# Patient Record
Sex: Female | Born: 1970 | Race: Black or African American | Hispanic: No | Marital: Single | Smoking: Never smoker
Health system: Southern US, Community
[De-identification: ages and names within clinical notes are randomized; demographics above are authoritative.]

---

## 1998-11-15 ENCOUNTER — Inpatient Hospital Stay (HOSPITAL_COMMUNITY): Admission: AD | Admit: 1998-11-15 | Discharge: 1998-11-17 | Payer: Self-pay | Admitting: Pediatrics

## 1998-12-24 ENCOUNTER — Other Ambulatory Visit: Admission: RE | Admit: 1998-12-24 | Discharge: 1998-12-24 | Payer: Self-pay | Admitting: Obstetrics and Gynecology

## 1999-01-15 ENCOUNTER — Other Ambulatory Visit: Admission: RE | Admit: 1999-01-15 | Discharge: 1999-01-15 | Payer: Self-pay | Admitting: Obstetrics and Gynecology

## 1999-01-15 ENCOUNTER — Encounter (INDEPENDENT_AMBULATORY_CARE_PROVIDER_SITE_OTHER): Payer: Self-pay

## 2001-03-11 ENCOUNTER — Other Ambulatory Visit: Admission: RE | Admit: 2001-03-11 | Discharge: 2001-03-11 | Payer: Self-pay | Admitting: Obstetrics and Gynecology

## 2002-06-03 ENCOUNTER — Encounter: Payer: Self-pay | Admitting: General Surgery

## 2002-06-03 ENCOUNTER — Ambulatory Visit (HOSPITAL_COMMUNITY): Admission: RE | Admit: 2002-06-03 | Discharge: 2002-06-04 | Payer: Self-pay | Admitting: General Surgery

## 2002-06-03 ENCOUNTER — Encounter (INDEPENDENT_AMBULATORY_CARE_PROVIDER_SITE_OTHER): Payer: Self-pay | Admitting: Specialist

## 2002-10-17 ENCOUNTER — Other Ambulatory Visit: Admission: RE | Admit: 2002-10-17 | Discharge: 2002-10-17 | Payer: Self-pay | Admitting: Obstetrics and Gynecology

## 2003-11-08 ENCOUNTER — Other Ambulatory Visit: Admission: RE | Admit: 2003-11-08 | Discharge: 2003-11-08 | Payer: Self-pay | Admitting: Obstetrics and Gynecology

## 2004-09-11 ENCOUNTER — Encounter: Admission: RE | Admit: 2004-09-11 | Discharge: 2004-09-11 | Payer: Self-pay | Admitting: Internal Medicine

## 2005-03-04 ENCOUNTER — Other Ambulatory Visit: Admission: RE | Admit: 2005-03-04 | Discharge: 2005-03-04 | Payer: Self-pay | Admitting: Obstetrics and Gynecology

## 2007-03-17 ENCOUNTER — Encounter: Admission: RE | Admit: 2007-03-17 | Discharge: 2007-03-17 | Payer: Self-pay | Admitting: Internal Medicine

## 2010-06-14 NOTE — Op Note (Signed)
NAME:  Erika Moran, Erika Moran                       ACCOUNT NO.:  [id number]   MEDICAL RECORD NO.:  [id number]                   PATIENT TYPE:  OIB   LOCATION:  2550                                 FACILITY:  [facility]   PHYSICIAN:  Gabrielle Dare. Janee Morn, M.D.             DATE OF BIRTH:  01/12/1971   DATE OF PROCEDURE:  06/03/2002  DATE OF DISCHARGE:                                 OPERATIVE REPORT   PREOPERATIVE DIAGNOSIS:  Symptomatic cholelithiasis.   POSTOPERATIVE DIAGNOSIS:  Symptomatic cholelithiasis.   OPERATION PERFORMED:  Laparoscopic cholecystectomy with intraoperative  cholangiogram.   SURGEON:  Gabrielle Dare. Janee Morn, M.D.   ASSISTANT:  Donnie Coffin. Samuella Cota, M.D.   ANESTHESIA:  General.   INDICATIONS FOR PROCEDURE:  The patient is a 40 year old African-American  female who has had increasing frequency of right upper quadrant pain over  the past year and a half.  Several weeks ago it was exacerbated and she was  worked up with an abdominal ultrasound which demonstrated findings of  cholelithiasis and some gallbladder wall thickening.  She presents for an  elective laparoscopic cholecystectomy.   DESCRIPTION OF PROCEDURE:  Informed consent was obtained.  The patient  received intravenous antibiotics.  She was taken to the operating room.  General anesthesia was administered.  Her abdomen was prepped and draped in  sterile fashion.  A curvilinear infraumbilical incision was made.  Subcutaneous tissues were dissected down to the anterior fascia which was  divided.  Subsequently, the peritoneal cavity was entered under direct  vision without difficulty.  A 0 Vicryl pursestring suture was placed around  the fascial opening and the Hasson trocar was inserted into the abdomen.  The abdomen was insufflated with carbon dioxide in standard fashion and an  11 mm epigastric and two 5 mm lateral ports were placed under direct vision.  The dome of the gallbladder was then retracted superomedially.   Several  omental adhesions were taken down from the gallbladder using the Bovie  cautery and the gallbladder was long and narrow and appeared to taper down  only to about 1.1 or 1.5 cm in diameter.  Further dissection revealed a  midcommon bile duct which externally appeared somewhat dilated.  The cystic  duct and common bile duct junction was not initially clearly visible.  Subsequently the gallbladder was released medially and laterally under  direct vision in order to facilitate better dissection.  The cystic artery  was identified near Calot's triangle as the cystic duct came more into view.  This artery was in the way of dissection.  It was clipped once distally,  twice proximally and divided.  Further dissection facilitated creation of a  window around the infundibulum and cystic duct area well above the common  bile duct.  Once a nice window was made between this area and the liver, a  Reddick cholangiogram catheter was inserted high up on the infundibulum  area.  Initially only a  cholecystogram could be obtained.  Subsequently  further dissection facilitated placement of the cholangiogram catheter a  little bit down towards the cystic duct.  Again the Reddick cholangiogram  catheter was inserted there.  An intraoperative cholangiogram was shot.  This demonstrated a somewhat dilated common duct with some decent length of  cystic duct remaining and there was no filling defect in the common duct and  the contrast flowed freely down into the duodenum.  Subsequently the Reddick  cholangiogram catheter was withdrawn.  This infundibulocystic duct area was  divided as it was too large to be sealed with a clip.  After it was divided,  the cystic duct was closed with a 0 PDS Endoloop without difficulty.  There  was good integrity of the closure on inspection.  Subsequently, the  gallbladder was taken off of the liver bed with Bovie cautery.  The cautery  was used to get good hemostasis on the  liver bed.  Once the gallbladder was  taken off of the liver bed, it was placed in an EndoCatch bag and removed  via the umbilical port site.  At this time the abdomen was copiously  irrigated.  The liver bed was reinspected and several areas were cauterized  for excellent hemostasis.  A total of 3L of irrigation was used as there was  some bile spillage due to the fact that we had to divide the infundibular  area.  The abdomen was copiously irrigated.  Meticulous hemostasis of the  liver bed was ensured and the irrigation was suctioned out and it was clear.  The port sites were removed under direct vision and pneumoperitoneum was  released.  The umbilical fascia was closed by tying the 0 Vicryl pursestring  suture.  All four wounds were copiously irrigated.  0.25% Marcaine with  epinephrine had been used local pain control at the port sites.  Subsequently, sponge, needle and instrument counts were checked and found to  be correct and all four wounds were closed with a running 4-0 Vicryl  subcuticular stitch.  Benzoin, Steri-Strips and sterile dressings were  applied.  The patient tolerated the procedure well without complication and  was taken to the recovery room in stable condition.                                                Gabrielle Dare Janee Morn, M.D.    BET/MEDQ  D:  06/03/2002  T:  06/06/2002  Job:  [id number]

## 2013-06-06 ENCOUNTER — Other Ambulatory Visit: Payer: Self-pay | Admitting: Obstetrics and Gynecology

## 2016-04-25 ENCOUNTER — Other Ambulatory Visit: Payer: Self-pay | Admitting: Obstetrics and Gynecology

## 2016-04-25 DIAGNOSIS — R928 Other abnormal and inconclusive findings on diagnostic imaging of breast: Secondary | ICD-10-CM

## 2016-05-06 ENCOUNTER — Ambulatory Visit
Admission: RE | Admit: 2016-05-06 | Discharge: 2016-05-06 | Disposition: A | Discharge status: Home / Self Care | Payer: BLUE CROSS/BLUE SHIELD | Source: Ambulatory Visit | Attending: Obstetrics and Gynecology | Admitting: Obstetrics and Gynecology

## 2016-05-06 DIAGNOSIS — R928 Other abnormal and inconclusive findings on diagnostic imaging of breast: Secondary | ICD-10-CM

## 2017-01-27 ENCOUNTER — Emergency Department (HOSPITAL_COMMUNITY)
Admission: EM | Admit: 2017-01-27 | Discharge: 2017-01-27 | Disposition: A | Discharge status: Home / Self Care | Payer: BLUE CROSS/BLUE SHIELD | Attending: Emergency Medicine | Admitting: Emergency Medicine

## 2017-01-27 ENCOUNTER — Encounter (HOSPITAL_COMMUNITY): Payer: Self-pay | Admitting: Emergency Medicine

## 2017-01-27 ENCOUNTER — Other Ambulatory Visit: Payer: Self-pay

## 2017-01-27 ENCOUNTER — Emergency Department (HOSPITAL_COMMUNITY): Payer: BLUE CROSS/BLUE SHIELD

## 2017-01-27 DIAGNOSIS — S62639A Displaced fracture of distal phalanx of unspecified finger, initial encounter for closed fracture: Secondary | ICD-10-CM

## 2017-01-27 DIAGNOSIS — Y939 Activity, unspecified: Secondary | ICD-10-CM | POA: Insufficient documentation

## 2017-01-27 DIAGNOSIS — Z79899 Other long term (current) drug therapy: Secondary | ICD-10-CM | POA: Diagnosis not present

## 2017-01-27 DIAGNOSIS — M25561 Pain in right knee: Secondary | ICD-10-CM | POA: Diagnosis not present

## 2017-01-27 DIAGNOSIS — S62634A Displaced fracture of distal phalanx of right ring finger, initial encounter for closed fracture: Secondary | ICD-10-CM | POA: Diagnosis not present

## 2017-01-27 DIAGNOSIS — Y999 Unspecified external cause status: Secondary | ICD-10-CM | POA: Diagnosis not present

## 2017-01-27 DIAGNOSIS — W19XXXA Unspecified fall, initial encounter: Secondary | ICD-10-CM

## 2017-01-27 DIAGNOSIS — Y9222 Religious institution as the place of occurrence of the external cause: Secondary | ICD-10-CM | POA: Diagnosis not present

## 2017-01-27 DIAGNOSIS — W01198A Fall on same level from slipping, tripping and stumbling with subsequent striking against other object, initial encounter: Secondary | ICD-10-CM | POA: Diagnosis not present

## 2017-01-27 DIAGNOSIS — S6981XA Other specified injuries of right wrist, hand and finger(s), initial encounter: Secondary | ICD-10-CM | POA: Diagnosis present

## 2017-01-27 MED ORDER — NAPROXEN 500 MG PO TABS: 500.0000 mg | ORAL_TABLET | Freq: Two times a day (BID) | ORAL | 0 refills | Status: DC | PRN

## 2017-01-27 MED ORDER — KETOROLAC TROMETHAMINE 60 MG/2ML IM SOLN
30.0000 mg | Freq: Once | INTRAMUSCULAR | Status: AC
  Administered 2017-01-27: 30 mg via INTRAMUSCULAR
  Filled 2017-01-27: qty 2

## 2017-01-27 NOTE — ED Notes (Signed)
Patient was able to walk in on her own.

## 2017-01-27 NOTE — ED Notes (Signed)
Splint was applied to right ring finger

## 2017-01-27 NOTE — ED Provider Notes (Signed)
[facility] Note   CSN: [id number] Arrival date & time: 01/27/17  0252     History   Chief Complaint Chief Complaint  Patient presents with  . Headache  . Fall  . Knee Pain    HPI Erika ShiverKatina R Moran is a 47 y.o. female.  The history is provided by the patient and medical records. No language interpreter was used.   Erika Moran is an otherwise healthy 47 y.o. female who presents to the Emergency Department for evaluation following a fall just prior to arrival.  Patient states that she slipped on something wet, causing her to fall and hit her right knee and landed on her right hand.  She landed on her right fourth finger oddly.  No loss of consciousness.  No prior dizziness or lightheadedness.  No nausea, vomiting or change in mental status.  No blurred vision.  She endorses persistent left-sided headache, right knee pain and right fourth finger pain.  No medications taken prior to arrival for symptoms.  Finger and knee hurt worse with movement.  She has been ambulatory and feels as if she can walk with assistance.  No numbness, tingling or weakness.  History reviewed. No pertinent past medical history.  There are no active problems to display for this patient.   History reviewed. No pertinent surgical history.  OB History    No data available       Home Medications    Prior to Admission medications   Medication Sig Start Date End Date Taking? Authorizing Provider  cetirizine (ZYRTEC) 10 MG tablet Take 10 mg by mouth daily.   Yes [provider]  omeprazole (PRILOSEC) 20 MG capsule Take 20 mg by mouth daily.   Yes [provider]  naproxen (NAPROSYN) 500 MG tablet Take 1 tablet (500 mg total) by mouth 2 (two) times daily as needed. 01/27/17   Sadao Weyer, Chase PicketJaime Pilcher, PA-C    Family History History reviewed. No pertinent family history.  Social History Social History   Tobacco Use  . Smoking status:  Never Smoker  . Smokeless tobacco: Never Used  Substance Use Topics  . Alcohol use: No    Frequency: Never  . Drug use: No     Allergies   Shrimp [shellfish allergy]   Review of Systems Review of Systems  Musculoskeletal: Positive for arthralgias and myalgias.  Neurological: Positive for headaches. Negative for syncope, weakness and numbness.  All other systems reviewed and are negative.    Physical Exam Updated Vital Signs BP (!) 148/71 (BP Location: Right Arm)   Pulse 80   Temp 98.2 F (36.8 C) (Oral)   Resp 16   Ht 4\' 11"  (1.499 m)   Wt 97.5 kg (215 lb)   LMP 01/10/2017 Comment: pt shielded  SpO2 100%   BMI 43.42 kg/m   Physical Exam  Constitutional: She is oriented to person, place, and time. She appears well-developed and well-nourished. No distress.  HENT:  Head: Normocephalic. Head is without raccoon's eyes and without Battle's sign.    Right Ear: No hemotympanum.  Left Ear: No hemotympanum.  Nose: Nose normal.  Mouth/Throat: Uvula is midline and oropharynx is clear and moist.  Cardiovascular: Normal rate, regular rhythm and normal heart sounds.  No murmur heard. Pulmonary/Chest: Effort normal and breath sounds normal. No respiratory distress.  Abdominal: Soft. She exhibits no distension. There is no tenderness.  Musculoskeletal:  Right index finger with tenderness to DIP joint. Able to flex the  DIP joint, but not fully extend the joint.  Right knee with diffuse tenderness. No open wounds. Full ROM. No joint line tenderness. No bruising. No varus/valgus laxity. Negative drawer's, Lachman's and McMurray's.  No crepitus. 2+ DP pulses bilaterally. All compartments are soft. Sensation intact distal to injury.   Neurological: She is alert and oriented to person, place, and time.  Alert, oriented, thought content appropriate, able to give a coherent history. Speech is clear and goal oriented, able to follow commands.  Cranial Nerves:  II:  Peripheral visual  fields grossly normal, pupils equal, round, reactive to light III, IV, VI: EOM intact bilaterally, ptosis not present V,VII: smile symmetric, eyes kept closed tightly against resistance, facial light touch sensation equal VIII: hearing grossly normal IX, X: symmetric soft palate movement, uvula elevates symmetrically  XI: bilateral shoulder shrug symmetric and strong XII: midline tongue extension 5/5 muscle strength in upper and lower extremities bilaterally including strong and equal grip strength and dorsiflexion/plantar flexion Sensory to light touch normal in all four extremities.  Normal finger-to-nose and rapid alternating movements; normal gait and balance. No drift.  Skin: Skin is warm and dry.  Nursing note and vitals reviewed.    ED Treatments / Results  Labs (all labs ordered are listed, but only abnormal results are displayed) Labs Reviewed - No data to display  EKG  EKG Interpretation None       Radiology Dg Knee Complete 4 Views Right  Result Date: 01/27/2017 CLINICAL DATA:  Pain after a fall. EXAM: RIGHT KNEE - COMPLETE 4+ VIEW COMPARISON:  None. FINDINGS: No evidence of acute fracture or dislocation of the right knee. No focal bone lesion or bone destruction. No significant effusion. Suggestion of soft tissue infiltration over the lateral and anterior aspect of the knee. This may indicate soft tissue contusion or edema. IMPRESSION: No acute bony abnormalities. Suggestion of soft tissue contusion or edema. Electronically Signed   By: Burman Nieves M.D.   On: 01/27/2017 03:56   Dg Finger Ring Right  Result Date: 01/27/2017 CLINICAL DATA:  Pain in the PIP joint of the right fourth finger after a fall. EXAM: RIGHT RING FINGER 2+V COMPARISON:  None. FINDINGS: Tiny osseous fragment over the dorsal aspect of the distal interphalangeal may represent avulsion injury. Mild soft tissue swelling. No dislocation of the interphalangeal joints. Joint spaces are preserved. No focal  bone lesion or bone destruction. IMPRESSION: Dorsal calcific fragment over the distal interphalangeal joint of the right fourth finger may indicate an avulsion injury. Soft tissue swelling. No other fractures identified. Electronically Signed   By: Burman Nieves M.D.   On: 01/27/2017 03:55    Procedures Procedures (including critical care time)  SPLINT APPLICATION Date/Time: 3:26 PM Authorized by: Chase Picket Hailey Miles Consent: Verbal consent obtained. Risks and benefits: risks, benefits and alternatives were discussed Consent given by: patient Splint applied by: emergency technician Location details: right 4th finger Splint type:finger splint Post-procedure: The splinted body part was neurovascularly unchanged following the procedure. Patient tolerance: Patient tolerated the procedure well with no immediate complications.   Medications Ordered in ED Medications  ketorolac (TORADOL) injection 30 mg (30 mg Intramuscular Given 01/27/17 0934)     Initial Impression / Assessment and Plan / ED Course  I have reviewed the triage vital signs and the nursing notes.  Pertinent labs & imaging results that were available during my care of the patient were reviewed by me and considered in my medical decision making (see chart for details).  MAEBELLE SULTON is a 47 y.o. female who presents to ED for evaluation after a fall. No focal neuro deficits. Canadian CT head rule 0. Do not feel imaging is needed for head at this time. Complaining of right knee pain and right 4th finger pain. Knee exam reassuring. X-ray negative. NVI. Able to ambulate without difficulty. Right 4th finger with dorsal calcific fragment over the DIP joint which may indicate an avulsion injury.  On exam, patient unable to fully extend the DIP joint.  Concern for extensor tendon injury patient splinted in full extension and stressed not to remove splint until seen by hand surgery.  She will call tomorrow to schedule follow-up  appointment.  Reasons to return to ER discussed and all questions answered.   Final Clinical Impressions(s) / ED Diagnoses   Final diagnoses:  Fall, initial encounter  Closed avulsion fracture of distal phalanx of finger, initial encounter    ED Discharge Orders        Ordered    naproxen (NAPROSYN) 500 MG tablet  2 times daily PRN     01/27/17 0956       Dany Harten, Chase Picket, PA-C 01/27/17 1529    Little, Ambrose Finland, MD 01/28/17 1507

## 2017-01-27 NOTE — Discharge Instructions (Signed)
It was my pleasure taking care of you today!   You must call the hand surgeon today or tomorrow to schedule a follow up appointment. You must keep your splint on with your finger completely straight until you see the hand doctor.   Naproxen as needed for pain. Ice for additional pain relief.  Return to ER for vomiting, change in mental status, new or worsening symptoms, any additional concerns.

## 2017-01-27 NOTE — ED Triage Notes (Signed)
The patient was at church she slid into a glass door.  She said she hit her left side of head, her right ring finger and right knee.  She rates her pain 7/10.  She has not taken anything for the pain.  She denies LOC, blurred vision, N/V or dizziness.  She was dizzy earlier but it has resolved.

## 2019-10-14 IMAGING — DX DG KNEE COMPLETE 4+V*R*
4 series · 4 of 4 positions shown · non-contrast
Comparison: None.

CLINICAL DATA: Pain after a fall.

EXAM:
RIGHT KNEE - COMPLETE 4+ VIEW

[knee ap]
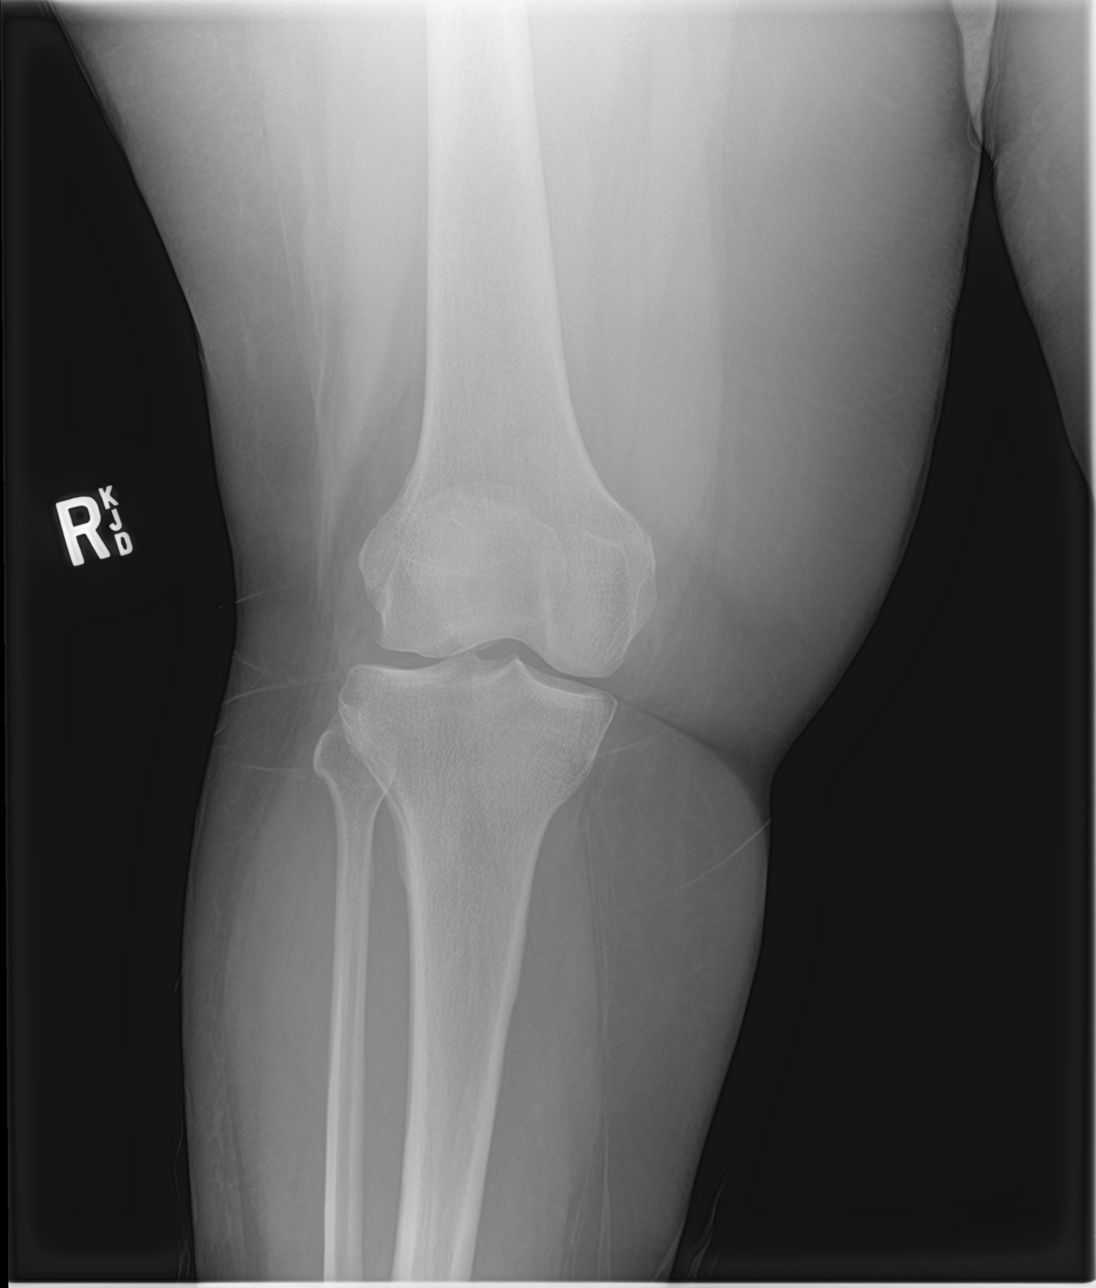

[knee lat]
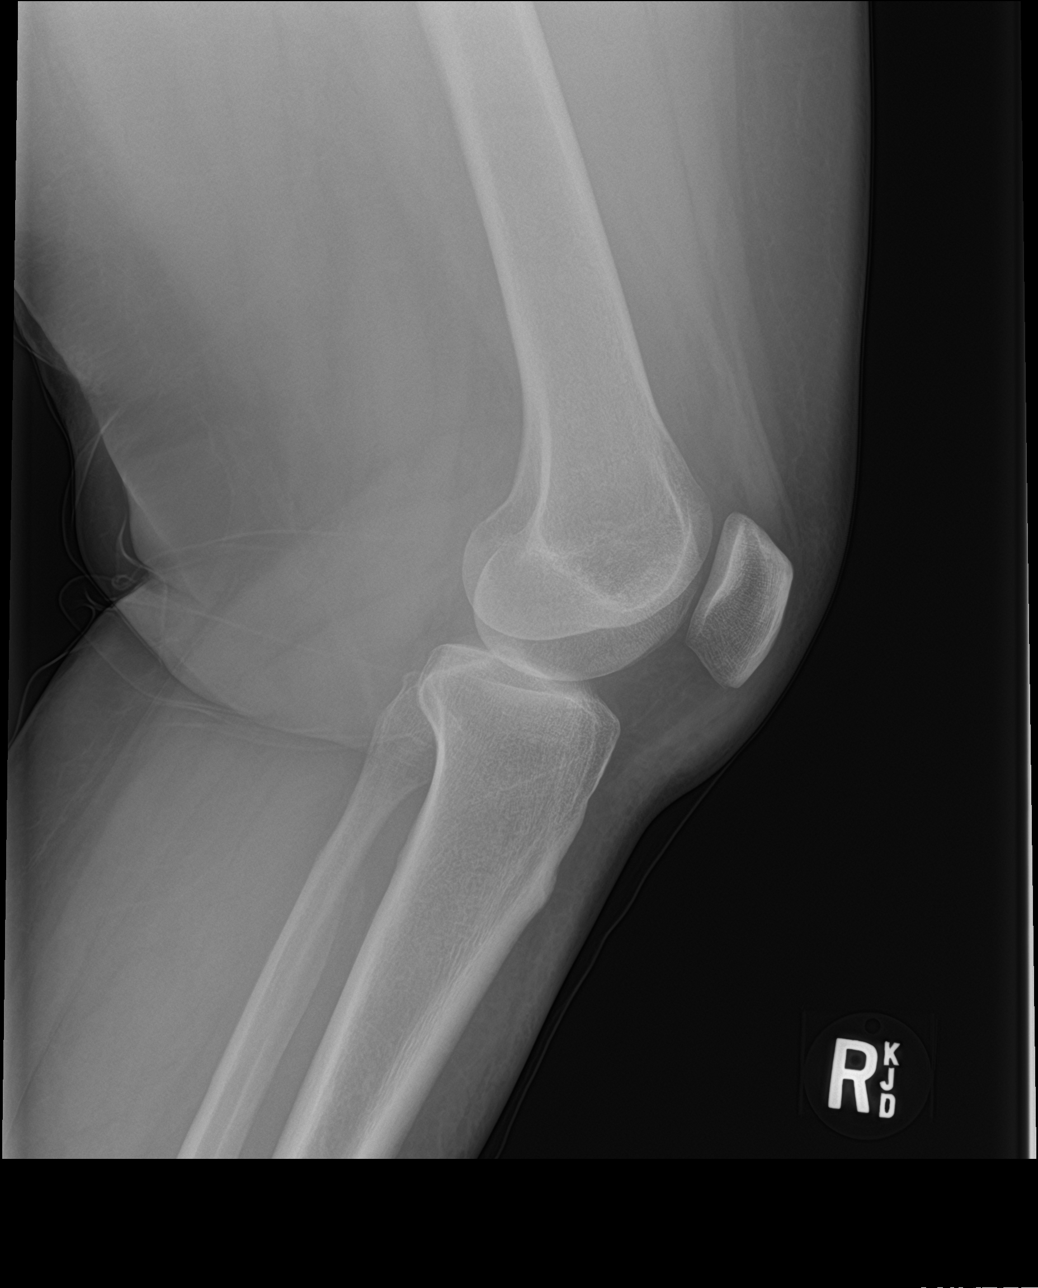

[knee obl (1 of 2)]
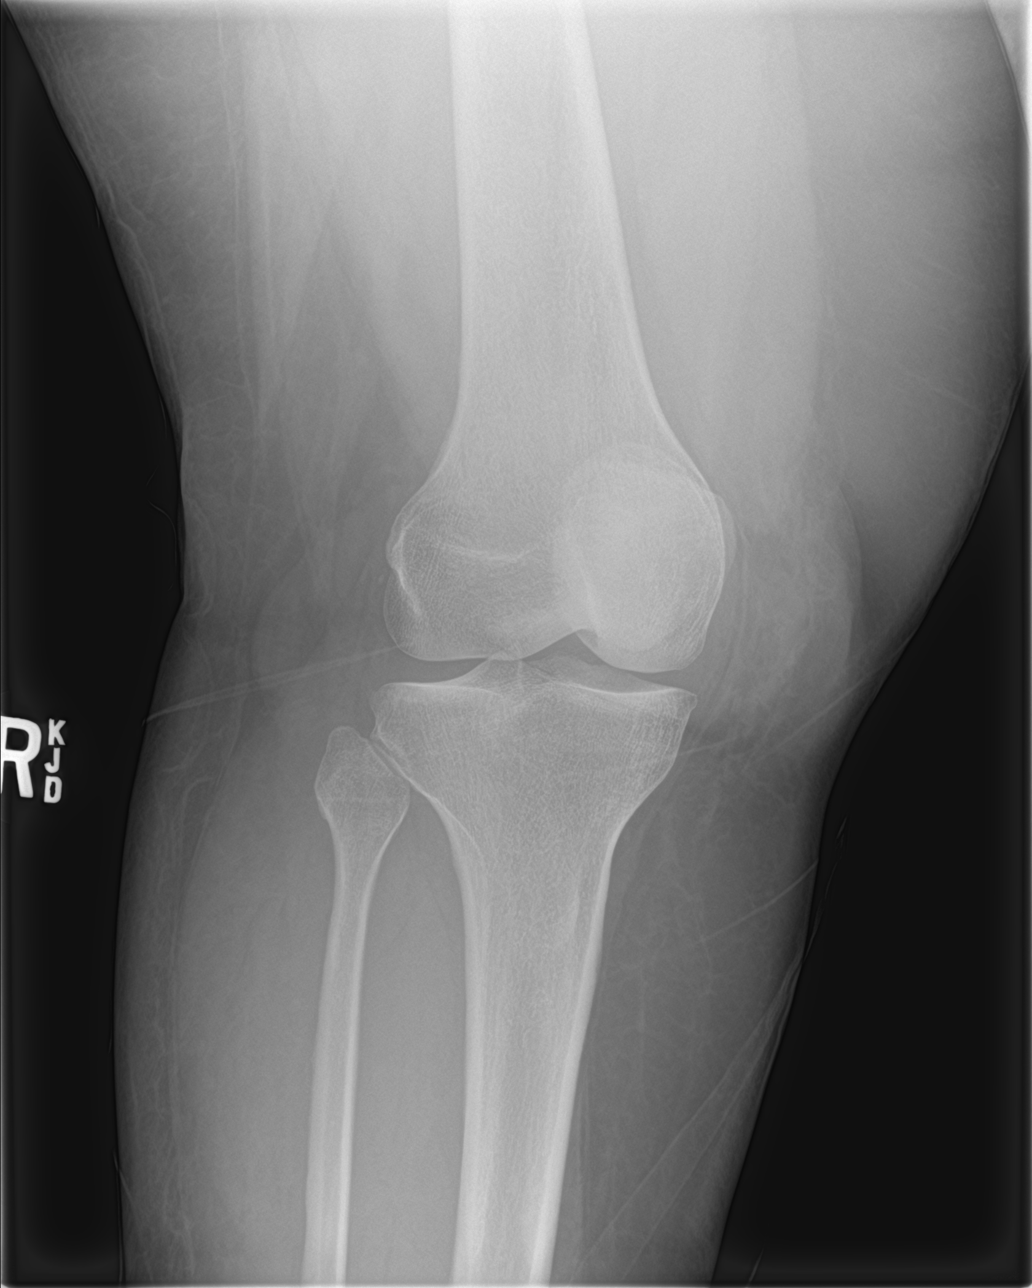

[knee obl (2 of 2)]
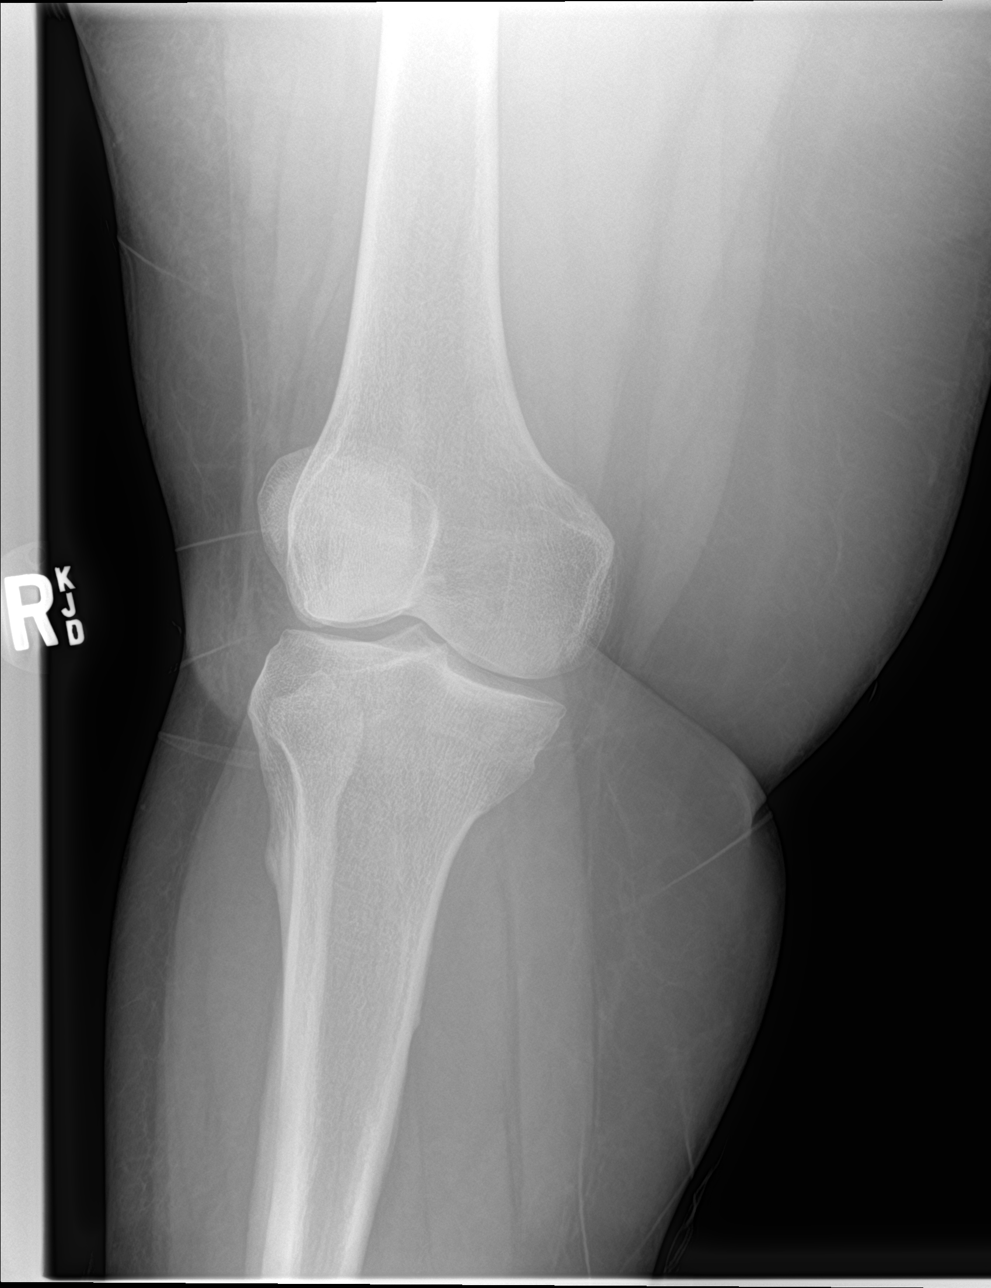

[4 of 4 positions shown; findings below may reference images not displayed]

FINDINGS: No evidence of acute fracture or dislocation of the right knee. No
focal bone lesion or bone destruction. No significant effusion.
Suggestion of soft tissue infiltration over the lateral and anterior
aspect of the knee. This may indicate soft tissue contusion or
edema.
IMPRESSION: No acute bony abnormalities. Suggestion of soft tissue contusion or
edema.

## 2019-10-14 IMAGING — DX DG FINGER RING 2+V*R*
3 series · 3 of 3 positions shown · non-contrast
Comparison: None.

CLINICAL DATA: Pain in the PIP joint of the right fourth finger
after a fall.

EXAM:
RIGHT RING FINGER 2+V

[finger ap]
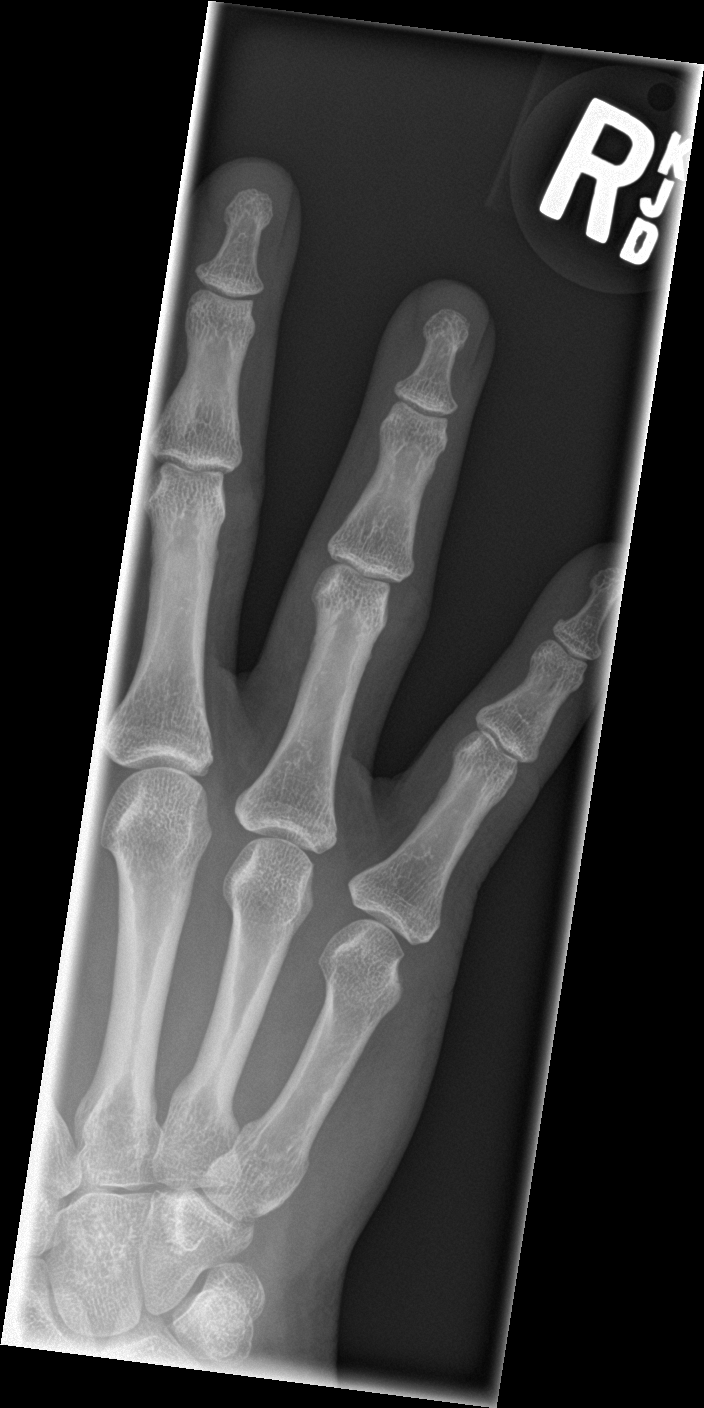

[finger obl]
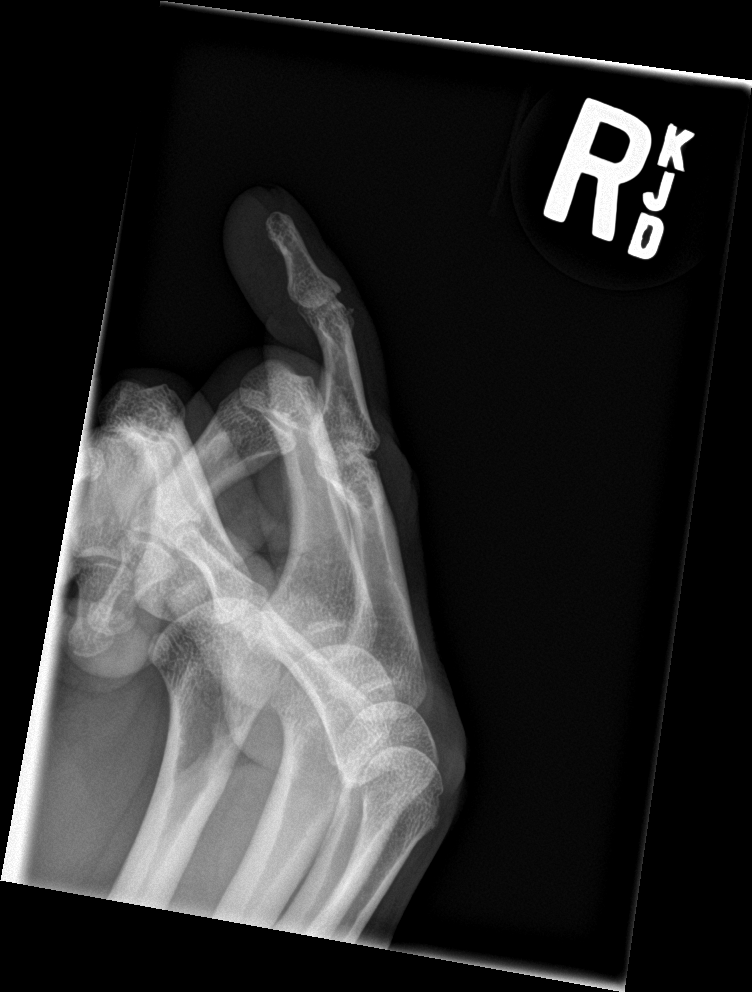

[finger lat]
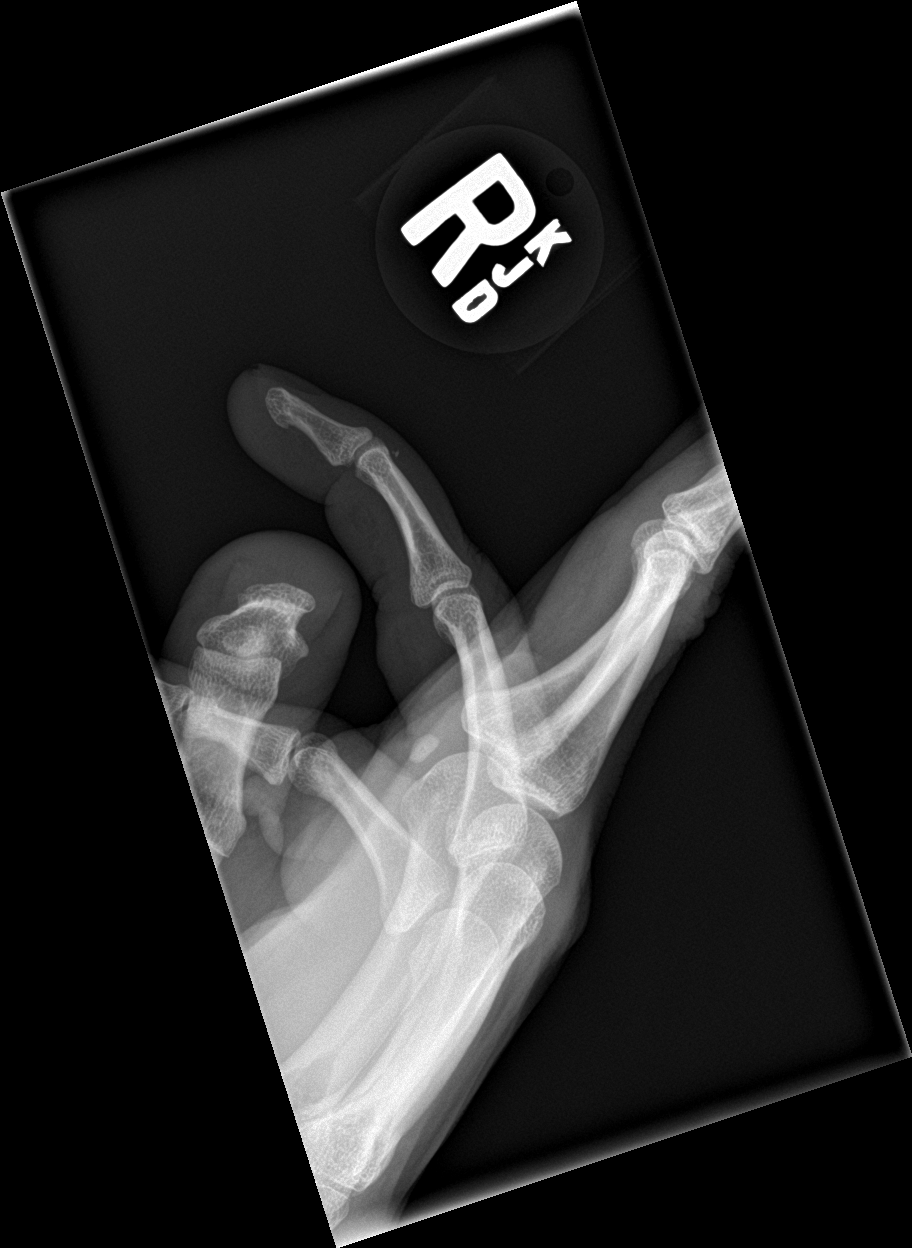

[3 of 3 positions shown; findings below may reference images not displayed]

FINDINGS: Tiny osseous fragment over the dorsal aspect of the distal
interphalangeal may represent avulsion injury. Mild soft tissue
swelling. No dislocation of the interphalangeal joints. Joint spaces
are preserved. No focal bone lesion or bone destruction.
IMPRESSION: Dorsal calcific fragment over the distal interphalangeal joint of
the right fourth finger may indicate an avulsion injury. Soft tissue
swelling. No other fractures identified.

## 2022-07-28 ENCOUNTER — Encounter: Payer: Self-pay | Admitting: Diagnostic Neuroimaging

## 2022-07-28 ENCOUNTER — Ambulatory Visit: Payer: BC Managed Care – PPO | Admitting: Diagnostic Neuroimaging

## 2022-07-28 VITALS — BP 118/82 | HR 72 | Ht 59.0 in | Wt 240.0 lb

## 2022-07-28 DIAGNOSIS — R200 Anesthesia of skin: Secondary | ICD-10-CM | POA: Diagnosis not present

## 2022-07-28 DIAGNOSIS — G5712 Meralgia paresthetica, left lower limb: Secondary | ICD-10-CM | POA: Diagnosis not present

## 2022-07-28 NOTE — Progress Notes (Signed)
[facility]  PATIENT: Erika Moran DOB: May 03, 1970  REFERRING CLINICIAN: Caffie Damme, MD HISTORY FROM: patient  REASON FOR VISIT: new consult   HISTORICAL  CHIEF COMPLAINT:  Chief Complaint  Patient presents with   New Patient (Initial Visit)    Patient in room #7 and alone.  Patient states has pain in both arms and left leg.    HISTORY OF PRESENT ILLNESS:   52 year old female here for evaluation of bilateral arm and left leg numbness and tingling sensations.  Patient has had intermittent tingling and numbness sensation in bilateral arms for many years.  This is intermittent and typically wakes her up from sleep.  Left side more affected than the right side.  She reports whole hand and forearm sensations.  She tried response for possible carpal tunnel syndrome but this has not helped.  Patient also has had some left lower back pain, left sciatica pain treated with steroid injection which has helped.  For past 3 months she is also had left anterolateral thigh numbness and tingling sensation.   REVIEW OF SYSTEMS: Full 14 system review of systems performed and negative with exception of: as per HPI.  ALLERGIES: Allergies  Allergen Reactions   Shrimp [Shellfish Allergy] Hives    HOME MEDICATIONS: Outpatient Medications Prior to Visit  Medication Sig Dispense Refill   atorvastatin (LIPITOR) 10 MG tablet Take 1 tablet by mouth at bedtime.     cetirizine (ZYRTEC) 10 MG tablet Take 10 mg by mouth daily.     cholecalciferol (VITAMIN D3) 25 MCG (1000 UNIT) tablet Take 1,000 Units by mouth daily.     hydrochlorothiazide (HYDRODIURIL) 12.5 MG tablet Take 12.5 mg by mouth as needed.     losartan (COZAAR) 25 MG tablet Take 1 tablet by mouth daily.     metFORMIN (GLUCOPHAGE) 1000 MG tablet Take 1 tablet by mouth every evening.     omeprazole (PRILOSEC) 20 MG capsule Take 20 mg by mouth daily.     omeprazole (PRILOSEC) 40 MG capsule Take 1 tablet by mouth  daily.     pregabalin (LYRICA) 75 MG capsule Take 75 mg by mouth 2 (two) times daily.     No facility-administered medications prior to visit.    PAST MEDICAL HISTORY: No past medical history on file.  PAST SURGICAL HISTORY: No past surgical history on file.  FAMILY HISTORY: No family history on file.  SOCIAL HISTORY: Social History   Socioeconomic History   Marital status: Single    Spouse name: Not on file   Number of children: Not on file   Years of education: Not on file   Highest education level: Not on file  Occupational History   Not on file  Tobacco Use   Smoking status: Never   Smokeless tobacco: Never  Substance and Sexual Activity   Alcohol use: No   Drug use: No   Sexual activity: Not on file  Other Topics Concern   Not on file  Social History Narrative   Not on file   Social Determinants of Health   Financial Resource Strain: Not on file  Food Insecurity: Not on file  Transportation Needs: Not on file  Physical Activity: Not on file  Stress: Not on file  Social Connections: Not on file  Intimate Partner Violence: Not on file     PHYSICAL EXAM  GENERAL EXAM/CONSTITUTIONAL: Vitals:  Vitals:   07/28/22 0807  BP: 118/82  Pulse: 72  Weight: 240 lb (108.9 kg)  Height: 4'  11" (1.499 m)   Body mass index is 48.47 kg/m. Wt Readings from Last 3 Encounters:  07/28/22 240 lb (108.9 kg)  01/27/17 215 lb (97.5 kg)   Patient is in no distress; well developed, nourished and groomed; neck is supple  CARDIOVASCULAR: Examination of carotid arteries is normal; no carotid bruits Regular rate and rhythm, no murmurs Examination of peripheral vascular system by observation and palpation is normal  EYES: Ophthalmoscopic exam of optic discs and posterior segments is normal; no papilledema or hemorrhages No results found.  MUSCULOSKELETAL: Gait, strength, tone, movements noted in Neurologic exam below  NEUROLOGIC: MENTAL STATUS:      No data to  display         awake, alert, oriented to person, place and time recent and remote memory intact normal attention and concentration language fluent, comprehension intact, naming intact fund of knowledge appropriate  CRANIAL NERVE:  2nd - no papilledema on fundoscopic exam 2nd, 3rd, 4th, 6th - pupils equal and reactive to light, visual fields full to confrontation, extraocular muscles intact, no nystagmus 5th - facial sensation symmetric 7th - facial strength symmetric 8th - hearing intact 9th - palate elevates symmetrically, uvula midline 11th - shoulder shrug symmetric 12th - tongue protrusion midline  MOTOR:  normal bulk and tone, full strength in the BUE, BLE  SENSORY:  normal and symmetric to light touch, pinprick, temperature, vibration; EXCEPT LEFT LATERAL THIGH TINGLING / NUMBNESS  COORDINATION:  finger-nose-finger, fine finger movements normal  REFLEXES:  deep tendon reflexes 1+ and symmetric  GAIT/STATION:  narrow based gait     DIAGNOSTIC DATA (LABS, IMAGING, TESTING) - I reviewed patient records, labs, notes, testing and imaging myself where available.  No results found for: "WBC", "HGB", "HCT", "MCV", "PLT" No results found for: "NA", "K", "CL", "CO2", "GLUCOSE", "BUN", "CREATININE", "CALCIUM", "PROT", "ALBUMIN", "AST", "ALT", "ALKPHOS", "BILITOT", "GFRNONAA", "GFRAA" No results found for: "CHOL", "HDL", "LDLCALC", "LDLDIRECT", "TRIG", "CHOLHDL" No results found for: "HGBA1C" No results found for: "VITAMINB12" No results found for: "TSH"  A1c 5.7    ASSESSMENT AND PLAN  52 y.o. year old female here with bilateral upper extremity numbness and tingling sensation intermittently for past few years, and left anterolateral thigh numbness for the last 3 months.  Neurologic examination is unremarkable.  Do not suspect underlying widespread polyneuropathy.  Likely represents focal compression nerve irritation as below.   Dx:  1. Meralgia paraesthetica,  left   2. Bilateral hand numbness     PLAN:  BILATERAL HAND / ARM NUMBNESS (since past few years; wakes up at night; intermittent; suspect mild bilateral carpal tunnel syndrome) - monitor symptoms; use wrist splints at night; if symptoms worsen, then consider EMG/NCS for carpal tunnel syndrome evaluation  LEFT ANTERO-LATERAL THIGH NUMBNESS (since ~April 2024; likely meralgia paresthetica) - conservative mgmt; PT evaluation; weight loss; avoid compression; start gentle exercise program vs PT evaluation - follow up with PCP or pain mgmt for nerve block injections; consider ibuprofen / tylenol as needed  Return for pending if symptoms worsen or fail to improve.    Suanne Marker, MD 07/28/2022, 9:23 AM Certified in Neurology, Neurophysiology and Neuroimaging  [facility] [address], [address] [place], [place] [address] [phone]

## 2022-07-28 NOTE — Patient Instructions (Signed)
  BILATERAL HAND / ARM NUMBNESS (wakes up at night; intermittent; suspect mild bilateral carpal tunnel syndrome) - monitor symptoms; use wrist splints at night; if symptoms worsen, then consider EMG/NCS for carpal tunnel syndrome evaluation  LEFT ANTERO-LATERAL THIGH NUMBNESS (likely meralgia paresthetica) - conservative mgmt; PT evaluation; weight loss; avoid compression; start gentle exercise program vs PT evaluation - follow up with PCP or pain mgmt for nerve block injections; consider ibuprofen / tylenol as needed

## 2022-11-24 ENCOUNTER — Other Ambulatory Visit: Payer: Self-pay | Admitting: Obstetrics and Gynecology

## 2022-11-24 DIAGNOSIS — R928 Other abnormal and inconclusive findings on diagnostic imaging of breast: Secondary | ICD-10-CM

## 2022-12-11 ENCOUNTER — Ambulatory Visit
Admission: RE | Admit: 2022-12-11 | Discharge: 2022-12-11 | Disposition: A | Discharge status: Home / Self Care | Payer: BC Managed Care – PPO | Source: Ambulatory Visit | Attending: Obstetrics and Gynecology | Admitting: Obstetrics and Gynecology

## 2022-12-11 ENCOUNTER — Ambulatory Visit: Payer: BLUE CROSS/BLUE SHIELD

## 2022-12-11 DIAGNOSIS — R928 Other abnormal and inconclusive findings on diagnostic imaging of breast: Secondary | ICD-10-CM

## 2023-12-16 ENCOUNTER — Other Ambulatory Visit (HOSPITAL_COMMUNITY): Payer: Self-pay

## 2023-12-16 MED ORDER — BICTEGRAVIR-EMTRICITAB-TENOFOV 50-200-25 MG PO TABS
1.0000 | ORAL_TABLET | Freq: Every day | ORAL | 0 refills | Status: AC
  Filled 2023-12-16: qty 7, 7d supply, fill #0
  Filled 2023-12-22: qty 7, 7d supply, fill #1
  Filled 2023-12-22: qty 21, 21d supply, fill #1

## 2023-12-21 ENCOUNTER — Other Ambulatory Visit (HOSPITAL_COMMUNITY): Payer: Self-pay

## 2023-12-22 ENCOUNTER — Other Ambulatory Visit (HOSPITAL_COMMUNITY): Payer: Self-pay

## 2024-01-11 ENCOUNTER — Other Ambulatory Visit (HOSPITAL_COMMUNITY): Payer: Self-pay

## 2024-01-13 ENCOUNTER — Other Ambulatory Visit (HOSPITAL_COMMUNITY): Payer: Self-pay
